# Patient Record
Sex: Male | Born: 1955 | Race: White | Hispanic: No
Health system: Southern US, Community
[De-identification: ages and names within clinical notes are randomized; demographics above are authoritative.]

## PROBLEM LIST (undated history)

## (undated) DIAGNOSIS — I10 Essential (primary) hypertension: Secondary | ICD-10-CM

## (undated) DIAGNOSIS — I4901 Ventricular fibrillation: Secondary | ICD-10-CM

## (undated) DIAGNOSIS — N400 Enlarged prostate without lower urinary tract symptoms: Secondary | ICD-10-CM

## (undated) DIAGNOSIS — G20A1 Parkinson's disease without dyskinesia, without mention of fluctuations: Secondary | ICD-10-CM

## (undated) DIAGNOSIS — R32 Unspecified urinary incontinence: Secondary | ICD-10-CM

## (undated) DIAGNOSIS — G20 Parkinson's disease: Secondary | ICD-10-CM

## (undated) DIAGNOSIS — R251 Tremor, unspecified: Secondary | ICD-10-CM

## (undated) DIAGNOSIS — N390 Urinary tract infection, site not specified: Secondary | ICD-10-CM

## (undated) DIAGNOSIS — K219 Gastro-esophageal reflux disease without esophagitis: Secondary | ICD-10-CM

## (undated) DIAGNOSIS — F319 Bipolar disorder, unspecified: Secondary | ICD-10-CM

## (undated) DIAGNOSIS — R4701 Aphasia: Secondary | ICD-10-CM

## (undated) DIAGNOSIS — I639 Cerebral infarction, unspecified: Secondary | ICD-10-CM

## (undated) DIAGNOSIS — E7800 Pure hypercholesterolemia, unspecified: Secondary | ICD-10-CM

## (undated) DIAGNOSIS — C189 Malignant neoplasm of colon, unspecified: Secondary | ICD-10-CM

## (undated) DIAGNOSIS — Z95810 Presence of automatic (implantable) cardiac defibrillator: Secondary | ICD-10-CM

---

## 2017-06-02 ENCOUNTER — Emergency Department: Payer: Medicare Other

## 2017-06-02 ENCOUNTER — Emergency Department
Admission: EM | Admit: 2017-06-02 | Discharge: 2017-06-03 | Disposition: A | Discharge status: Skilled Nursing Facility | Payer: Medicare Other | Attending: Emergency Medicine | Admitting: Emergency Medicine

## 2017-06-02 ENCOUNTER — Encounter: Payer: Self-pay | Admitting: Emergency Medicine

## 2017-06-02 DIAGNOSIS — Y999 Unspecified external cause status: Secondary | ICD-10-CM | POA: Insufficient documentation

## 2017-06-02 DIAGNOSIS — W06XXXA Fall from bed, initial encounter: Secondary | ICD-10-CM | POA: Diagnosis not present

## 2017-06-02 DIAGNOSIS — I10 Essential (primary) hypertension: Secondary | ICD-10-CM | POA: Diagnosis not present

## 2017-06-02 DIAGNOSIS — Y92122 Bedroom in nursing home as the place of occurrence of the external cause: Secondary | ICD-10-CM | POA: Diagnosis not present

## 2017-06-02 DIAGNOSIS — Y9389 Activity, other specified: Secondary | ICD-10-CM | POA: Diagnosis not present

## 2017-06-02 DIAGNOSIS — G20 Parkinson's disease: Secondary | ICD-10-CM | POA: Diagnosis not present

## 2017-06-02 DIAGNOSIS — S0990XA Unspecified injury of head, initial encounter: Secondary | ICD-10-CM | POA: Diagnosis not present

## 2017-06-02 DIAGNOSIS — Z7901 Long term (current) use of anticoagulants: Secondary | ICD-10-CM | POA: Diagnosis not present

## 2017-06-02 DIAGNOSIS — I69959 Hemiplegia and hemiparesis following unspecified cerebrovascular disease affecting unspecified side: Secondary | ICD-10-CM | POA: Insufficient documentation

## 2017-06-02 HISTORY — DX: Cerebral infarction, unspecified: I63.9

## 2017-06-02 HISTORY — DX: Gastro-esophageal reflux disease without esophagitis: K21.9

## 2017-06-02 HISTORY — DX: Urinary tract infection, site not specified: N39.0

## 2017-06-02 HISTORY — DX: Ventricular fibrillation: I49.01

## 2017-06-02 HISTORY — DX: Essential (primary) hypertension: I10

## 2017-06-02 HISTORY — DX: Parkinson's disease without dyskinesia, without mention of fluctuations: G20.A1

## 2017-06-02 HISTORY — DX: Parkinson's disease: G20

## 2017-06-02 NOTE — ED Triage Notes (Signed)
Pt denies nausea, loc.

## 2017-06-02 NOTE — ED Triage Notes (Signed)
Pt from [facility]. Per ems pt fell this pm striking head. Pt has no obvious injury and denies complaints. Pt with history of CVA with right sided weakness.

## 2017-06-02 NOTE — ED Provider Notes (Signed)
[facility] Emergency Department Provider Note    First MD Initiated Contact with Patient 06/02/17 2321     (approximate)  I have reviewed the triage vital signs and the nursing notes.   HISTORY  Chief Complaint Fall   HPI Robert Ho is a 61 y.o. male with below last of chronic medical conditions including CVA with right sided deficit currently taking Coumadin last INR 4 presents to the emergency department after an accidental fall.  Patient said he stood up quickly from bed and lost his balance resulting in fall.  Patient does admit to striking his head during fall without any loss of consciousness.  Patient denies any symptoms at present.   Past Medical History:  Diagnosis Date  . CVA (cerebral vascular accident) ([place])   . GERD (gastroesophageal reflux disease)   . Hypertension   . Parkinson's disease ([place])   . UTI (urinary tract infection)   . 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   History reviewed. No pertinent surgical history.  Prior to Admission medications   Not on File    Allergies No known drug allergies History reviewed. No pertinent family history.  Social History Social History   Tobacco Use  . Smoking status: Unknown If Ever Smoked  Substance Use Topics  . Alcohol use: No    Frequency: Never  . Drug use: No    Review of Systems Constitutional: No fever/chills Eyes: No visual changes. ENT: No sore throat. Cardiovascular: Denies chest pain. Respiratory: Denies shortness of breath. Gastrointestinal: No abdominal pain.  No nausea, no vomiting.  No diarrhea.  No constipation. Genitourinary: Negative for dysuria. Musculoskeletal: Negative for neck pain.  Negative for back pain. Integumentary: Negative for rash. Neurological: Negative for headaches, focal weakness or numbness.   ____________________________________________   PHYSICAL  EXAM:  VITAL SIGNS: ED Triage Vitals [06/02/17 2320]  Enc Vitals Group     BP 117/68     Pulse Rate 70     Resp 16     Temp 98.2 F (36.8 C)     Temp Source Oral     SpO2 97 %     Weight 81.6 kg (180 lb)     Height 1.829 m (6')     Head Circumference      Peak Flow      Pain Score      Pain Loc      Pain Edu?      Excl. in [place]?     Constitutional: Alert and oriented. Well appearing and in no acute distress. Eyes: Conjunctivae are normal. PERRL. EOMI. Head: Atraumatic. Mouth/Throat: Mucous membranes are moist.  Oropharynx non-erythematous. Neck: No stridor.  Cardiovascular: Normal rate, regular rhythm. Good peripheral circulation. Grossly normal heart sounds. Respiratory: Normal respiratory effort.  No retractions. Lungs CTAB. Gastrointestinal: Soft and nontender. No distention.  Musculoskeletal: No lower extremity tenderness nor edema. No gross deformities of extremities. Neurologic: Baseline speech and right arm leg deficit per patient.  No deficit noted on the left upper or lower extremities  skin:  Skin is warm, dry and intact. No rash noted. Psychiatric: Mood and affect are normal. Speech and behavior are normal.    RADIOLOGY I, [place], personally viewed and evaluated these images (plain radiographs) as part of my medical decision making, as well as reviewing the written report by the radiologist.  Ct Head Wo Contrast  Result Date: 06/03/2017  CLINICAL DATA:  Fall today striking head. EXAM: CT HEAD WITHOUT CONTRAST TECHNIQUE: Contiguous axial images were obtained from the base of the skull through the vertex without intravenous contrast. COMPARISON:  None. FINDINGS: Brain: Large remote left MCA infarct with encephalomalacia and ex vacuo dilatation of the left lateral ventricle. No intracranial hemorrhage. No subdural or intracranial fluid collection. No evidence of acute ischemia. No midline shift. Vascular: Atherosclerosis of skullbase vasculature without  hyperdense vessel or abnormal calcification. Skull: No fracture or focal lesion. Sinuses/Orbits: Paranasal sinuses and mastoid air cells are clear. The visualized orbits are unremarkable. Other: None. IMPRESSION: 1.  No acute intracranial abnormality.  No skull fracture. 2. Large remote left MCA infarct with encephalomalacia. Electronically Signed   By: Jeb Levering M.D.   On: 06/03/2017 00:24     Procedures   ____________________________________________   INITIAL IMPRESSION / ASSESSMENT AND PLAN / ED COURSE  As part of my medical decision making, I reviewed the following data within the [place] male presented with above-stated history and physical exam CT scan of the head revealed no acute intracranial abnormality.  Patient has no symptoms at this time and no complaints. ____________________________________________  FINAL CLINICAL IMPRESSION(S) / ED DIAGNOSES  Final diagnoses:  Injury of head, initial encounter     MEDICATIONS GIVEN DURING THIS VISIT:  Medications - No data to display   ED Discharge Orders    None       Note:  This document was prepared using Dragon voice recognition software and may include unintentional dictation errors.    Gregor Hams, MD 06/03/17 [phone]

## 2017-06-02 NOTE — ED Notes (Signed)
Patient transported to CT 

## 2017-06-03 NOTE — ED Notes (Signed)
Attempt to call report to [facility] x2 without success.

## 2017-06-03 NOTE — ED Notes (Signed)
Pt sitting in bed, watching tv.

## 2017-06-03 NOTE — ED Notes (Signed)
Pt sleeping. 

## 2017-06-03 NOTE — ED Notes (Signed)
Ems here for transport. Discharge papers provided for [facility] and given to ems by Estill Bamberg, rd Network engineer.

## 2017-06-05 ENCOUNTER — Encounter: Payer: Self-pay | Admitting: Emergency Medicine

## 2017-06-05 ENCOUNTER — Emergency Department: Payer: Medicare Other

## 2017-06-05 ENCOUNTER — Emergency Department
Admission: EM | Admit: 2017-06-05 | Discharge: 2017-06-05 | Disposition: A | Discharge status: Skilled Nursing Facility | Payer: Medicare Other | Attending: Emergency Medicine | Admitting: Emergency Medicine

## 2017-06-05 DIAGNOSIS — I10 Essential (primary) hypertension: Secondary | ICD-10-CM | POA: Insufficient documentation

## 2017-06-05 DIAGNOSIS — Y92199 Unspecified place in other specified residential institution as the place of occurrence of the external cause: Secondary | ICD-10-CM | POA: Diagnosis not present

## 2017-06-05 DIAGNOSIS — Y999 Unspecified external cause status: Secondary | ICD-10-CM | POA: Diagnosis not present

## 2017-06-05 DIAGNOSIS — Y939 Activity, unspecified: Secondary | ICD-10-CM | POA: Diagnosis not present

## 2017-06-05 DIAGNOSIS — Z9181 History of falling: Secondary | ICD-10-CM | POA: Diagnosis not present

## 2017-06-05 DIAGNOSIS — S0990XA Unspecified injury of head, initial encounter: Secondary | ICD-10-CM | POA: Diagnosis not present

## 2017-06-05 DIAGNOSIS — W19XXXA Unspecified fall, initial encounter: Secondary | ICD-10-CM | POA: Diagnosis not present

## 2017-06-05 DIAGNOSIS — Z8673 Personal history of transient ischemic attack (TIA), and cerebral infarction without residual deficits: Secondary | ICD-10-CM | POA: Insufficient documentation

## 2017-06-05 DIAGNOSIS — G20 Parkinson's disease: Secondary | ICD-10-CM | POA: Diagnosis not present

## 2017-06-05 DIAGNOSIS — Z0489 Encounter for examination and observation for other specified reasons: Secondary | ICD-10-CM | POA: Diagnosis not present

## 2017-06-05 HISTORY — DX: Bipolar disorder, unspecified: F31.9

## 2017-06-05 HISTORY — DX: Presence of automatic (implantable) cardiac defibrillator: Z95.810

## 2017-06-05 HISTORY — DX: Unspecified urinary incontinence: R32

## 2017-06-05 HISTORY — DX: Tremor, unspecified: R25.1

## 2017-06-05 HISTORY — DX: Malignant neoplasm of colon, unspecified: C18.9

## 2017-06-05 HISTORY — DX: Pure hypercholesterolemia, unspecified: E78.00

## 2017-06-05 HISTORY — DX: Aphasia: R47.01

## 2017-06-05 HISTORY — DX: Benign prostatic hyperplasia without lower urinary tract symptoms: N40.0

## 2017-06-05 LAB — BASIC METABOLIC PANEL
ANION GAP: 8 (ref 5–15)
BUN: 12 mg/dL (ref 6–20)
CALCIUM: 8.7 mg/dL — AB (ref 8.9–10.3)
CO2: 29 mmol/L (ref 22–32)
Chloride: 105 mmol/L (ref 101–111)
Creatinine, Ser: 0.95 mg/dL (ref 0.61–1.24)
Glucose, Bld: 84 mg/dL (ref 65–99)
POTASSIUM: 3.8 mmol/L (ref 3.5–5.1)
Sodium: 142 mmol/L (ref 135–145)

## 2017-06-05 LAB — URINALYSIS, COMPLETE (UACMP) WITH MICROSCOPIC
BACTERIA UA: NONE SEEN
BILIRUBIN URINE: NEGATIVE
GLUCOSE, UA: NEGATIVE mg/dL
Hgb urine dipstick: NEGATIVE
Ketones, ur: NEGATIVE mg/dL
LEUKOCYTES UA: NEGATIVE
NITRITE: NEGATIVE
PH: 7 (ref 5.0–8.0)
PROTEIN: NEGATIVE mg/dL
Specific Gravity, Urine: 1.011 (ref 1.005–1.030)

## 2017-06-05 LAB — CBC WITH DIFFERENTIAL/PLATELET
BASOS ABS: 0 10*3/uL (ref 0–0.1)
BASOS PCT: 1 %
Eosinophils Absolute: 0.2 10*3/uL (ref 0–0.7)
Eosinophils Relative: 5 %
HEMATOCRIT: 37.4 % — AB (ref 40.0–52.0)
Hemoglobin: 12.4 g/dL — ABNORMAL LOW (ref 13.0–18.0)
Lymphocytes Relative: 19 %
Lymphs Abs: 0.7 10*3/uL — ABNORMAL LOW (ref 1.0–3.6)
MCH: 29.5 pg (ref 26.0–34.0)
MCHC: 33.2 g/dL (ref 32.0–36.0)
MCV: 89 fL (ref 80.0–100.0)
MONO ABS: 0.5 10*3/uL (ref 0.2–1.0)
Monocytes Relative: 14 %
NEUTROS ABS: 2.2 10*3/uL (ref 1.4–6.5)
NEUTROS PCT: 61 %
Platelets: 132 10*3/uL — ABNORMAL LOW (ref 150–440)
RBC: 4.2 MIL/uL — AB (ref 4.40–5.90)
RDW: 16.4 % — AB (ref 11.5–14.5)
WBC: 3.6 10*3/uL — AB (ref 3.8–10.6)

## 2017-06-05 LAB — PROTIME-INR
INR: 1.82
PROTHROMBIN TIME: 20.9 s — AB (ref 11.4–15.2)

## 2017-06-05 NOTE — ED Notes (Addendum)
Attempted to call report to [facility] without success. 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

## 2017-06-05 NOTE — ED Notes (Signed)
Pt with left arm abrasion noted to anterior forearm extending to mid radial area. Dark purple bruising noted to mid anterior upper arm. Pt denies pain. perrl 12mm brisk. Pt with expressive aphagia, but is able to confirm month, birthday, situation and place of "hospital". Pt with edema noted to right hand with contracture noted to right hand.

## 2017-06-05 NOTE — ED Provider Notes (Signed)
[facility] Emergency Department Provider Note  ____________________________________________   I have reviewed the triage vital signs and the nursing notes.   HISTORY  Chief Complaint Fall   History limited by: Not Limited   HPI Robert Ho is a 61 y.o. male who presents to the emergency department today from living facility because of a fall.  TIMING: occurred suddenly CONTEXT: patient is coming from rehab facility. He is there after suffering a stroke. Apparently staff found him on the floor of his room today. The patient states that he was trying to get to the bathroom.  MODIFYING FACTORS: none ASSOCIATED SYMPTOMS: patient denies any new pain. Has some pain on the right side (the side affected by the stroke)  Per medical record review patient was seen in the emergency department a couple of days ago for a fall as well.   Past Medical History:  Diagnosis Date  . CVA (cerebral vascular accident) ([place])   . GERD (gastroesophageal reflux disease)   . Hypertension   . Parkinson's disease ([place])   . UTI (urinary tract infection)   . 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   No past surgical history on file.  Prior to Admission medications   Not on File    Allergies Patient has no known allergies.  No family history on file.  Social History Social History   Tobacco Use  . Smoking status: Unknown If Ever Smoked  Substance Use Topics  . Alcohol use: No    Frequency: Never  . Drug use: No    Review of Systems Constitutional: No fever/chills Eyes: No visual changes. ENT: No sore throat. Cardiovascular: Denies chest pain. Respiratory: Denies shortness of breath. Gastrointestinal: No abdominal pain.  No nausea, no vomiting.  No diarrhea.   Genitourinary: Negative for dysuria. Musculoskeletal: Positive for right arm and leg pain.  Skin: Negative for  rash. Neurological: Negative for headaches, focal weakness or numbness.  ____________________________________________   PHYSICAL EXAM:  VITAL SIGNS: ED Triage Vitals [06/05/17 0349]  Enc Vitals Group     BP 114/70     Pulse 58     Resp 12     Temp      Temp src      SpO2 96     Weight 180 lb (81.6 kg)     Height 6' (1.829 m)   Constitutional: Alert and oriented.  Eyes: Conjunctivae are normal.  ENT   Head: Normocephalic and atraumatic.   Nose: No congestion/rhinnorhea.   Mouth/Throat: Mucous membranes are moist.   Neck: No stridor. Hematological/Lymphatic/Immunilogical: No cervical lymphadenopathy. Cardiovascular: Normal rate, regular rhythm.  No murmurs, rubs, or gallops.  Respiratory: Normal respiratory effort without tachypnea nor retractions. Breath sounds are clear and equal bilaterally. No wheezes/rales/rhonchi. Gastrointestinal: Soft and non tender. No rebound. No guarding.  Genitourinary: Deferred Musculoskeletal: Normal range of motion in all extremities. No lower extremity edema. Neurologic:  Slurred speech. Right sided weakness. Skin:  Skin is warm, dry and intact. No rash noted. Psychiatric: Mood and affect are normal. Speech and behavior are normal. Patient exhibits appropriate insight and judgment.  ____________________________________________    LABS (pertinent positives/negatives)  UA not consistent with infection BMP within normal limits except ca 8.7 CBC wbc 3.6, hgb 12.4, plt 132  ____________________________________________   EKG  I, Nance Pear, attending physician, personally viewed and interpreted this EKG  EKG Time: 0352 Rate: 61 Rhythm: sinus rhythm  Axis: normal Intervals: qtc 467 QRS: narrow ST changes: no st elevation Impression: normal ekg   ____________________________________________    RADIOLOGY  CT head No acute  process  ____________________________________________   PROCEDURES  Procedures  ____________________________________________   INITIAL IMPRESSION / ASSESSMENT AND PLAN / ED COURSE  Pertinent labs & imaging results that were available during my care of the patient were reviewed by me and considered in my medical decision making (see chart for details).  Patient presented to the emergency department today because of an unwitnessed fall.  This is now the patient's second visit to the emergency department in the past week for falls.  Blood work and urine were checked to see if there is any reason past simply the CVA for the falls.  Does not show any concerning findings. Head CT was negative. Will plan on discharging back to living facility. Discussed findings and plan with patient.   ____________________________________________   FINAL CLINICAL IMPRESSION(S) / ED DIAGNOSES  Final diagnoses:  Fall, initial encounter     Note: This dictation was prepared with Dragon dictation. Any transcriptional errors that result from this process are unintentional     Nance Pear, MD 06/05/17 [phone]

## 2017-06-05 NOTE — Discharge Instructions (Signed)
Please seek medical attention for any high fevers, chest pain, shortness of breath, change in behavior, persistent vomiting, bloody stool or any other new or concerning symptoms.  

## 2017-06-05 NOTE — ED Notes (Signed)
Report to bill, rn.  

## 2017-06-05 NOTE — ED Notes (Signed)
Pt sleeping. 

## 2017-06-05 NOTE — ED Triage Notes (Signed)
Pt from [facility] with unwitnessed fall. Pt was here Friday for same. Pt with previous cva affecting right side. Pt denies pain or known injury.

## 2017-06-05 NOTE — ED Notes (Signed)
Pt sleeping. Moved to hall 5 for safety observation per flow co-ordinator

## 2018-12-07 IMAGING — CT CT HEAD W/O CM
3 of 7 series · 11 of 47 positions shown, 13 images · non-contrast
Comparison: Head CT dated 06/02/2017

CLINICAL DATA: 60-year-old male with head trauma.

EXAM:
CT HEAD WITHOUT CONTRAST
TECHNIQUE: Contiguous axial images were obtained from the base of the skull
through the vertex without intravenous contrast.

[Series 3: head wo · axial · 0.45mm/px · z∈[-130,-30]mm · 6 of 30 slices shown, 8 images]
[im 5/30  brain]
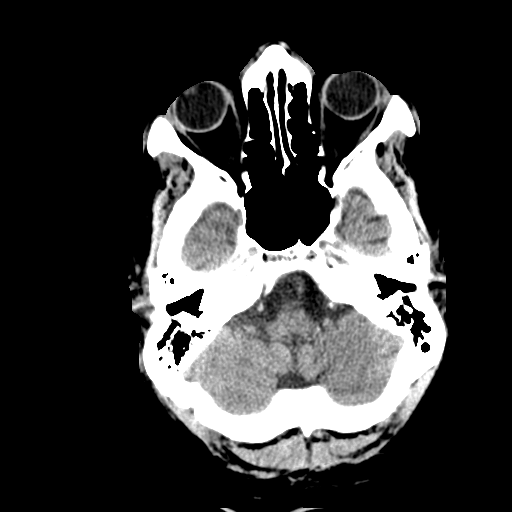
[im 5/30  bone]
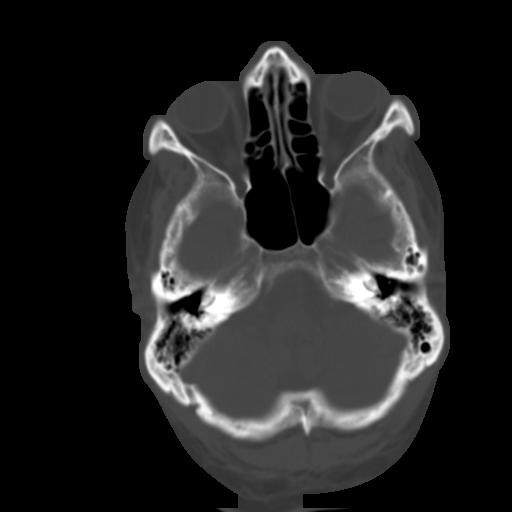
[im 9/30  brain]
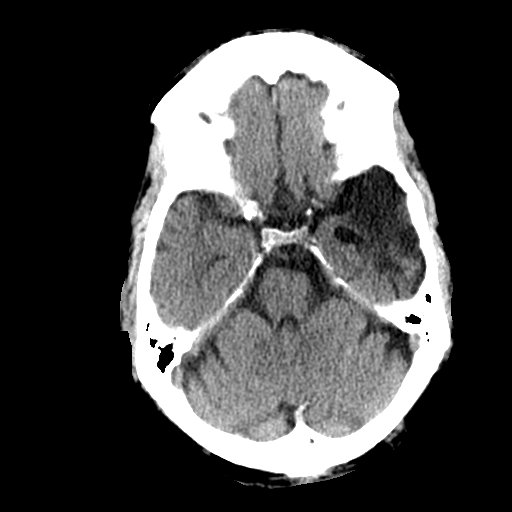
[im 13/30  brain]
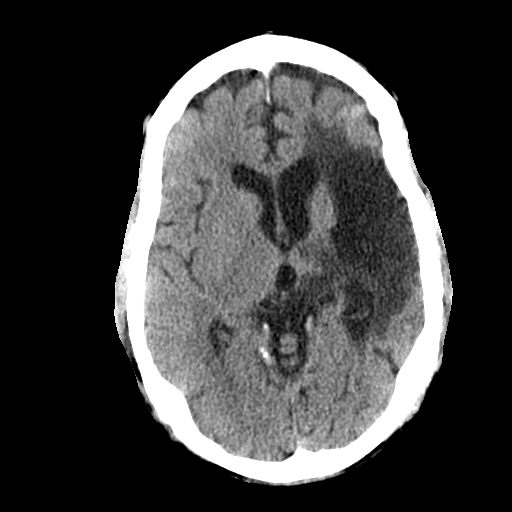
[im 17/30  brain]
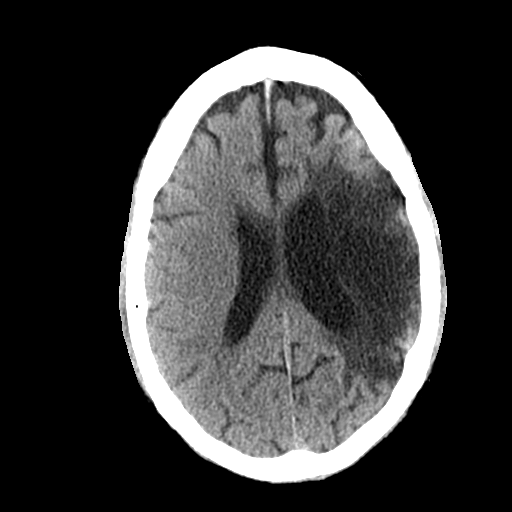
[im 21/30  brain]
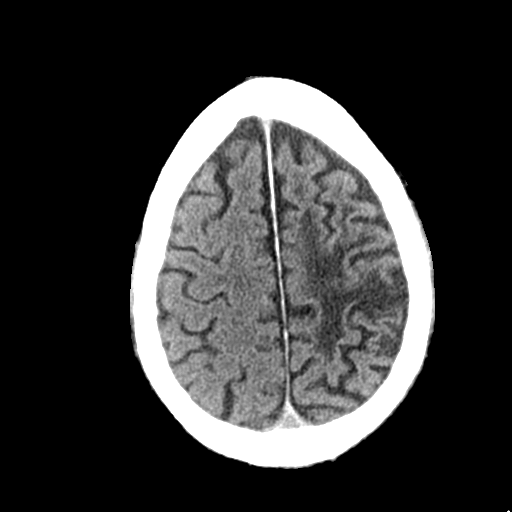
[im 21/30  bone]
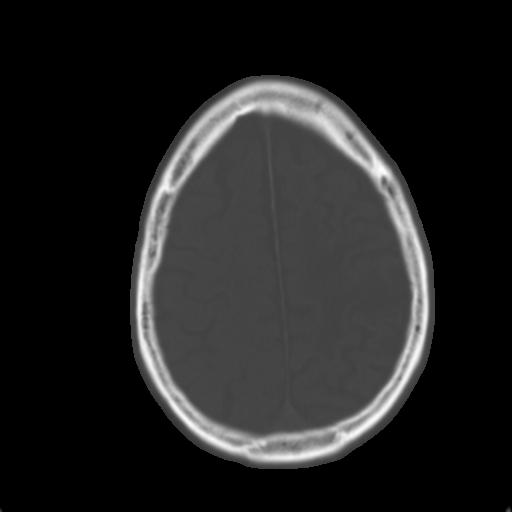
[im 25/30  brain]
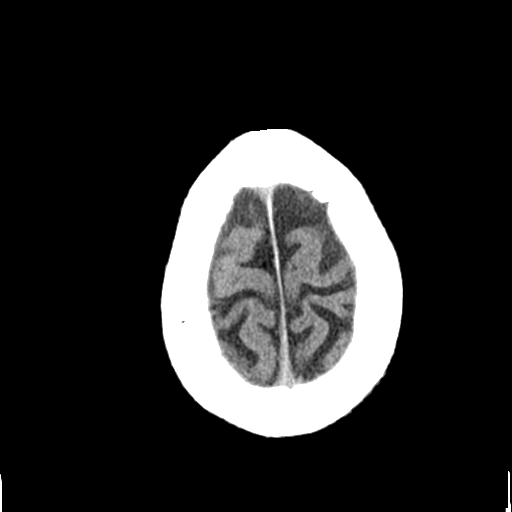

[Series 4: coronal soft tissue · coronal · 0.31mm/px · 3 of 74 slices shown]
[im 14/74  brain]
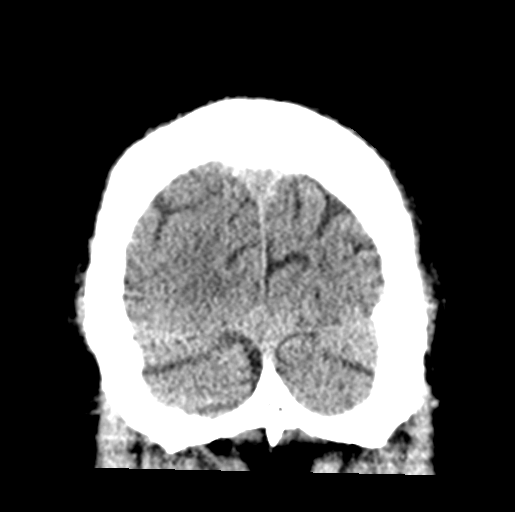
[im 27/74  brain]
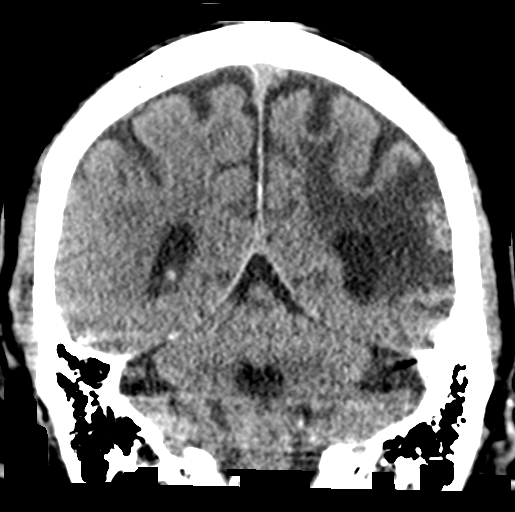
[im 40/74  brain]
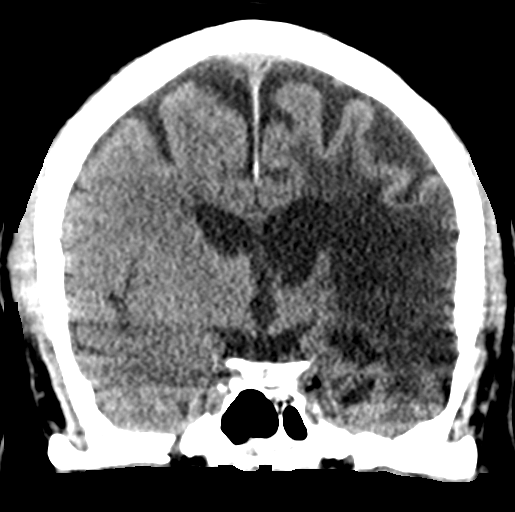

[Series 9: sagittal soft tissue · sagittal · 0.11mm/px · 2 of 56 slices shown]
[im 19/56  brain]
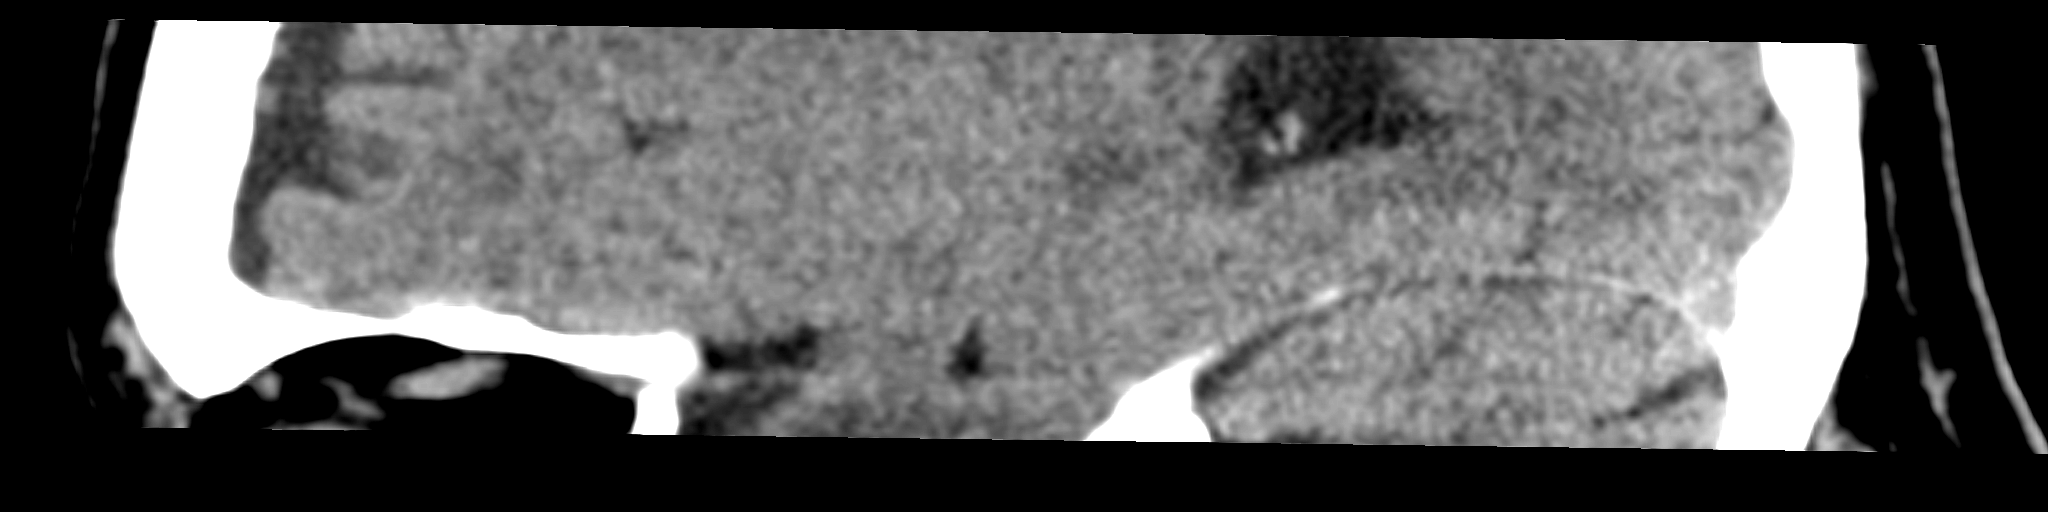
[im 37/56  brain]
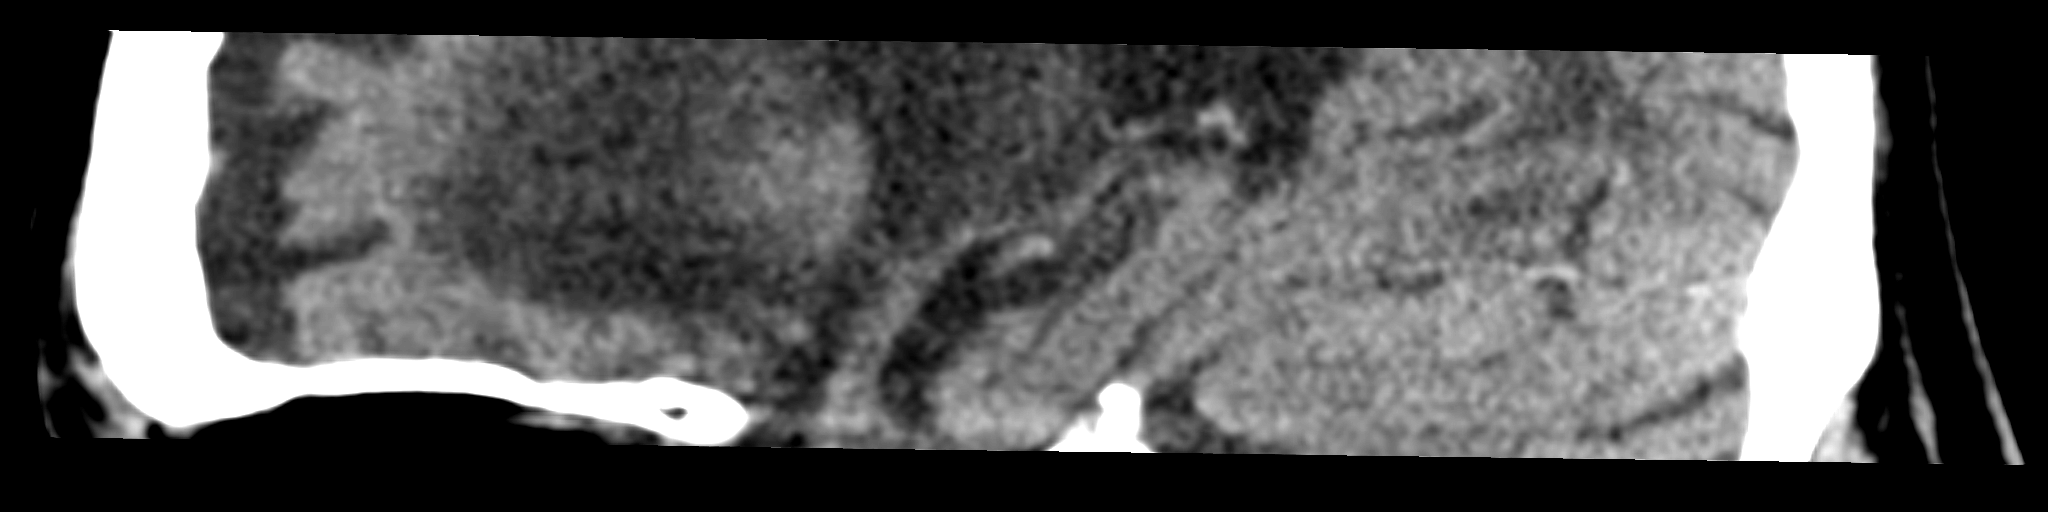

[11 of 47 positions shown; findings below may reference images not displayed]

FINDINGS: Evaluation of this exam is limited due to motion artifact.

Brain: There is a large area of old infarct and encephalomalacia
involving the left MCA territory. There is no acute intracranial
hemorrhage. No mass effect or midline shift. No extra-axial fluid
collection. Mild ex vacuo dilatation of the left lateral ventricle.

Vascular: No hyperdense vessel or unexpected calcification.

Skull: Normal. Negative for fracture or focal lesion.

Sinuses/Orbits: No acute finding.

Other: None
IMPRESSION: 1. No acute intracranial hemorrhage.
2. Large left MCA territory old infarct and encephalomalacia.
